# Patient Record
Sex: Male | Born: 1970 | Race: Black or African American | Hispanic: No | Marital: Married | Smoking: Never smoker
Health system: Southern US, Community
[De-identification: ages and names within clinical notes are randomized; demographics above are authoritative.]

---

## 2013-11-22 ENCOUNTER — Emergency Department (HOSPITAL_BASED_OUTPATIENT_CLINIC_OR_DEPARTMENT_OTHER)
Admission: EM | Admit: 2013-11-22 | Discharge: 2013-12-24 | Disposition: E | Payer: BC Managed Care – PPO | Attending: Emergency Medicine | Admitting: Emergency Medicine

## 2013-11-22 DIAGNOSIS — R059 Cough, unspecified: Secondary | ICD-10-CM | POA: Insufficient documentation

## 2013-11-22 DIAGNOSIS — R4182 Altered mental status, unspecified: Secondary | ICD-10-CM | POA: Insufficient documentation

## 2013-11-22 DIAGNOSIS — I469 Cardiac arrest, cause unspecified: Secondary | ICD-10-CM | POA: Insufficient documentation

## 2013-11-22 DIAGNOSIS — R05 Cough: Secondary | ICD-10-CM | POA: Insufficient documentation

## 2013-11-22 DIAGNOSIS — R230 Cyanosis: Secondary | ICD-10-CM | POA: Insufficient documentation

## 2013-11-22 DIAGNOSIS — J029 Acute pharyngitis, unspecified: Secondary | ICD-10-CM | POA: Insufficient documentation

## 2013-11-22 DIAGNOSIS — R404 Transient alteration of awareness: Secondary | ICD-10-CM | POA: Insufficient documentation

## 2013-11-22 DIAGNOSIS — R61 Generalized hyperhidrosis: Secondary | ICD-10-CM | POA: Insufficient documentation

## 2013-11-22 DIAGNOSIS — R0602 Shortness of breath: Secondary | ICD-10-CM | POA: Insufficient documentation

## 2013-11-22 DIAGNOSIS — I517 Cardiomegaly: Secondary | ICD-10-CM | POA: Insufficient documentation

## 2013-11-22 NOTE — ED Provider Notes (Signed)
CSN: [id number]     Arrival date & time 2013-08-24  2346 History   None    This chart was scribed for April Smitty CordsK Palumbo-Rasch, MD by Arlan OrganAshley Leger, ED Scribe. This patient was seen in room MHT14/MHT14 and the patient's care was started 11:58 PM.   No chief complaint on file.  Patient is a 43 y.o. male presenting with altered mental status. The history is provided by the spouse. The history is limited by the condition of the patient (apneic on arrival in triage). No language interpreter was used.  Altered Mental Status Presenting symptoms: unresponsiveness   Severity:  Severe Most recent episode:  Today Episode history:  Single Timing:  Constant Progression:  Worsening Chronicity:  New Associated symptoms comment:  Cough and sore throat for a few days then tonight SOB Wife reported patient had cough and sore throat and was taking cough medicine.  Called to the triage room.  Unresponsive, cyanotic and agonal with loss of continence.  No seizure like activity.  Moved immediately to rescucitation room and CPR begun.    LEAVE 5 CAVEAT DUE TO CONDITION   No past medical history on file. No past surgical history on file. No family history on file. History  Substance Use Topics  . Smoking status: Not on file  . Smokeless tobacco: Not on file  . Alcohol Use: Not on file    Review of Systems  Unable to perform ROS: Acuity of condition  HENT: Positive for sore throat.   Respiratory: Positive for cough and shortness of breath.       Allergies  Review of patient's allergies indicates not on file.  Home Medications   Prior to Admission medications   Not on File   Triage Vitals: There were no vitals taken for this visit.   Physical Exam  Constitutional: He appears well-developed and well-nourished. He appears distressed.  HENT:  Head: Normocephalic and atraumatic. Head is without raccoon's eyes and without Battle's sign.  Right Ear: External ear normal. No hemotympanum.  Left Ear:  External ear normal. No hemotympanum.  Mouth/Throat: No oropharyngeal exudate.  Eyes: Conjunctivae are normal.  Dilated pupils non reactive  Cardiovascular:  NO HR,  Absent pulses on first exam, cyanotic mid chest up and fingers  Pulmonary/Chest: No stridor. He is in respiratory distress.  In triage room agonal, then no respirations.  Rhonchi B with bagging  Abdominal: Soft. He exhibits no mass. Bowel sounds are absent. There is no rebound and no guarding.  Genitourinary: Penis normal.  Musculoskeletal: He exhibits no edema.  Neurological: He is unresponsive. GCS eye subscore is 1. GCS verbal subscore is 1. GCS motor subscore is 1.  Skin: No rash noted. He is diaphoretic. There is cyanosis.  Psychiatric:  unable    ED Course  Procedures (including critical care time)  See intubation note by Dr. Blinda [facility]woodPollina [facility]   Chief Complaint: Cardiac arrest/unresponsive   Level V Caveat: Unresponsive  ROS: Unable to obtain, Level V caveat   History reviewed. No pertinent past medical history. History reviewed. No pertinent past surgical history.    CRITICAL CARE Performed by: Jasmine AwePALUMBO-RASCH,APRIL K Total critical care time: 61 minutes Critical care time was exclusive of separately billable procedures and treating other patients. Critical care was necessary to treat or prevent imminent or life-threatening deterioration. Critical care was time spent personally by me on the following activities: development of treatment plan with patient and/or surrogate as well as nursing, discussions with consultants, evaluation of patient's response to  treatment, examination of patient, obtaining history from patient or surrogate, ordering and performing treatments and interventions, ordering and review of laboratory studies, ordering and review of radiographic studies, pulse oximetry and re-evaluation of patient's condition.  Cardiopulmonary Resuscitation (CPR) Procedure Note Directed/Performed by:  Jasmine AwePALUMBO-RASCH,APRIL K I personally directed ancillary staff and/or performed CPR in an effort to regain return of spontaneous circulation and to maintain cardiac, neuro and systemic perfusion.   Medical Decision making  DDx:  MI with vfib arrest PE with arrest  Medications  0.9 %  sodium chloride infusion ( Intravenous Stopped 12/17/2013 0031)  EPINEPHrine (ADRENALIN) 0.1 MG/ML injection (1 mg Intravenous Given 11/27/2013 0029)  sodium bicarbonate injection (50 mEq Intravenous Given 12/18/2013 0027)  calcium chloride injection (1 g Intravenous Given 12/08/2013 0024)  dextrose 50 % solution (25 g Intravenous Given 12/07/2013 0020)    Continuous high quality CPR started immediately and continued throughout 0004 Initial rhythm v fib Shocked-- no pulses CPR resumed immediately rhythm asystole 0009 Epi x 1 via ETT-- asystole 0012 EPI via IV---asystole 0015 EPI via IV---asystole 0020 1 amp sodium bicarb-- asystole 0020 1 amp D50 0024 1 amp calcium gluconate---asystole 0026- EPI via IV-- asystole 0028 Dr. Blinda LeatherwoodPollina spoke with family while EDP continued cardiac rescucitation 0029 EPI via IV--- asystole 0031-- time of death following bedside US no cardiac activity and asystole confirmed in 3 leads 0032 EDP informed the family of the patient's death.     Case discussed with Al Decantick Miller ME who initially took the case then called back to say ME for this case was Dr. Debroah LoopArnold  119 Case d/w Dr. Debroah LoopArnold, ME via phone who will accept the case.  Please maintain all lines and ETT.     [place] [facility] contacted by nurse Dianne Assessment and Plan  Despite rescucitation unable to reestablish circulation.  Times of death 00:31       Final diagnoses:  None  Cardiac arrest   I personally performed the services described in this documentation, which was scribed in my presence. The recorded information has been reviewed and is accurate.    Jasmine AweApril K Palumbo-Rasch, MD 12/01/2013 [phone]

## 2013-11-23 ENCOUNTER — Emergency Department (HOSPITAL_BASED_OUTPATIENT_CLINIC_OR_DEPARTMENT_OTHER): Payer: BC Managed Care – PPO

## 2013-11-23 ENCOUNTER — Encounter (HOSPITAL_BASED_OUTPATIENT_CLINIC_OR_DEPARTMENT_OTHER): Payer: Self-pay | Admitting: Emergency Medicine

## 2013-11-23 LAB — CBG MONITORING, ED: GLUCOSE-CAPILLARY: 96 mg/dL (ref 70–99)

## 2013-11-23 MED ORDER — EPINEPHRINE HCL 0.1 MG/ML IJ SOSY
PREFILLED_SYRINGE | INTRAMUSCULAR | Status: AC | PRN
  Administered 2013-11-23 (×5): 1 mg via INTRAVENOUS

## 2013-11-23 MED ORDER — DEXTROSE 50 % IV SOLN
INTRAVENOUS | Status: AC | PRN
  Administered 2013-11-23: 25 g via INTRAVENOUS

## 2013-11-23 MED ORDER — SODIUM CHLORIDE 0.9 % IV SOLN
INTRAVENOUS | Status: AC | PRN
  Administered 2013-11-23: 1000 mL via INTRAVENOUS

## 2013-11-23 MED ORDER — SODIUM BICARBONATE 8.4 % IV SOLN
INTRAVENOUS | Status: AC | PRN
  Administered 2013-11-23: 50 meq via INTRAVENOUS

## 2013-11-23 MED ORDER — AMMONIA AROMATIC IN INHA
RESPIRATORY_TRACT | Status: AC
  Filled 2013-11-23: qty 20

## 2013-11-23 MED ORDER — NALOXONE HCL 1 MG/ML IJ SOLN
INTRAMUSCULAR | Status: AC
  Filled 2013-11-23: qty 2

## 2013-11-23 MED ORDER — CALCIUM CHLORIDE 10 % IV SOLN
INTRAVENOUS | Status: AC | PRN
  Administered 2013-11-23: 1 g via INTRAVENOUS

## 2013-11-23 MED FILL — Medication: Qty: 1 | Status: AC

## 2013-11-23 DEATH — deceased

## 2013-12-24 NOTE — ED Provider Notes (Signed)
Procedure: Intubation Permit was implied secondary to emergent situation. A MAC 4 blade was inserted into the oropharynx at which time the vocal cords were visualized. A 7.5-French endotracheal tube was inserted and visualized going through the vocal cords. The stylette was removed. Colorimetric change was visualized on the CO2 meter. Breath sounds were heard in both lung fields equally. The endotracheal tube was placed at 25 cm, measured at the teeth.  Femoral arterial blood draw Landmarks were identified. Area was sterilely prepped. An 18-gauge 1-1/2 inch needle was advanced into the femoral artery under constant aspiration until blood was obtained. 20 mL was collected for laboratory.  Angiocath insertion Performed by: Gilda CreasePOLLINA, Jeronimo Hellberg J.  Consent: emergent Time out: Immediately prior to procedure a "time out" was called to verify the correct patient, procedure, equipment, support staff and site/side marked as required.  Preparation: Patient was prepped and draped in the usual sterile fashion.  Vein Location: right antecub  Ultrasound Guided  Gauge: 20G  Normal blood return and flush without difficulty Patient tolerance: Patient tolerated the procedure well with no immediate complications.     Gilda Creasehristopher J. Teigan Sahli, MD 12/10/2013 [phone]

## 2013-12-24 NOTE — ED Notes (Signed)
Per wife pt c/o SOB since 2200, pt was speaking in complete sentence, states pt finally decide to be checked out and brought here. Pt taken to rm 14 and pt went unconscious and Code started.

## 2013-12-24 NOTE — ED Notes (Signed)
Dr. Debroah LoopArnold ME [facility] Pts wife Andree Moronnette Thorton [phone] notified of pts transfer

## 2013-12-24 DEATH — deceased

## 2015-04-13 IMAGING — CR DG CHEST 1V PORT
1 series · 1 of 1 positions shown · non-contrast
Comparison: None.

CLINICAL DATA: Cardiac arrest

EXAM:
PORTABLE CHEST - 1 VIEW

[view not recorded]
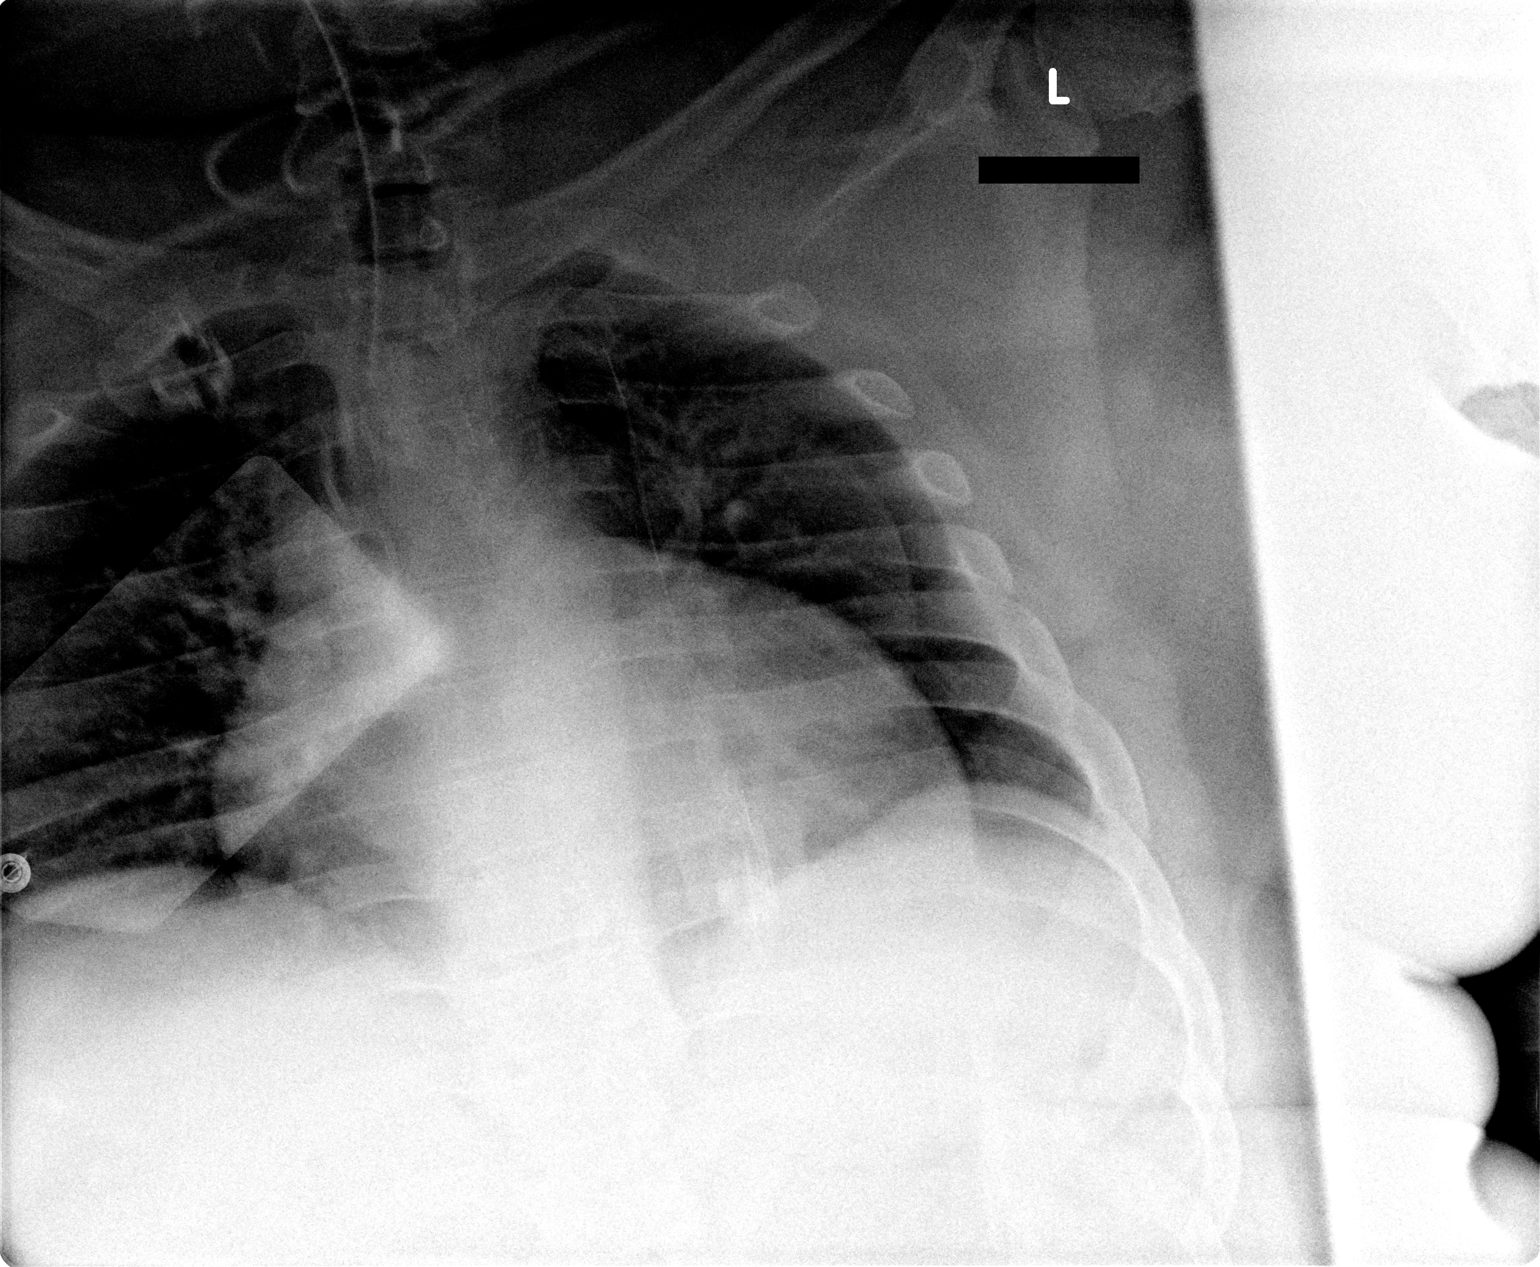

[1 of 1 positions shown; findings below may reference images not displayed]

FINDINGS: Exam detail is diminished due to motion artifact. There is in the ET
tube with tip above the carina. The heart size appears enlarged
which may be due to artifact from portable technique. The lung
volumes are low. No pleural effusions or edema. No airspace
consolidation.
IMPRESSION: 1. ET tube tip is in satisfactory position above the carina.
